# Patient Record
Sex: Female | Born: 1975 | Race: Asian | Hispanic: No | Marital: Married
Health system: Southern US, Community
[De-identification: ages and names within clinical notes are randomized; demographics above are authoritative.]

---

## 2013-09-07 ENCOUNTER — Other Ambulatory Visit: Payer: Self-pay

## 2013-12-02 ENCOUNTER — Other Ambulatory Visit (HOSPITAL_COMMUNITY): Payer: Self-pay | Admitting: Specialist

## 2013-12-02 DIAGNOSIS — O283 Abnormal ultrasonic finding on antenatal screening of mother: Secondary | ICD-10-CM

## 2013-12-14 ENCOUNTER — Encounter (HOSPITAL_COMMUNITY): Payer: Self-pay

## 2013-12-14 ENCOUNTER — Ambulatory Visit (HOSPITAL_COMMUNITY)
Admission: RE | Admit: 2013-12-14 | Discharge: 2013-12-14 | Disposition: A | Discharge status: Home / Self Care | Payer: BC Managed Care – PPO | Source: Ambulatory Visit | Attending: Specialist | Admitting: Specialist

## 2013-12-14 DIAGNOSIS — O283 Abnormal ultrasonic finding on antenatal screening of mother: Secondary | ICD-10-CM

## 2013-12-14 DIAGNOSIS — O289 Unspecified abnormal findings on antenatal screening of mother: Secondary | ICD-10-CM | POA: Insufficient documentation

## 2013-12-14 DIAGNOSIS — Z3689 Encounter for other specified antenatal screening: Secondary | ICD-10-CM | POA: Insufficient documentation

## 2014-05-30 ENCOUNTER — Encounter (HOSPITAL_COMMUNITY): Payer: Self-pay

## 2014-10-19 ENCOUNTER — Encounter (HOSPITAL_COMMUNITY): Payer: Self-pay | Admitting: *Deleted

## 2015-03-07 IMAGING — US US OB DETAIL+14 WK
1 series · 12 of 28 positions shown · non-contrast
Comparison: none

[Series 1: us ob detail+14 wk · 0.28mm/px · 80 acquisitions, 12 frames shown]
[im 3/80]
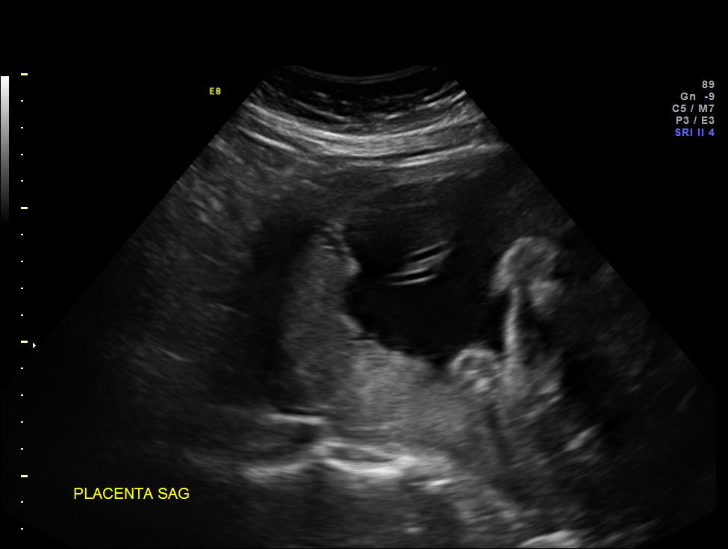
[im 9/80]
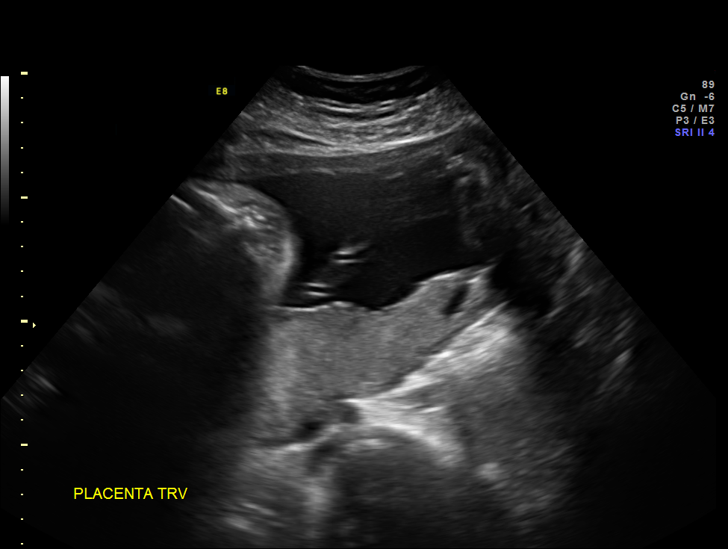
[im 15/80]
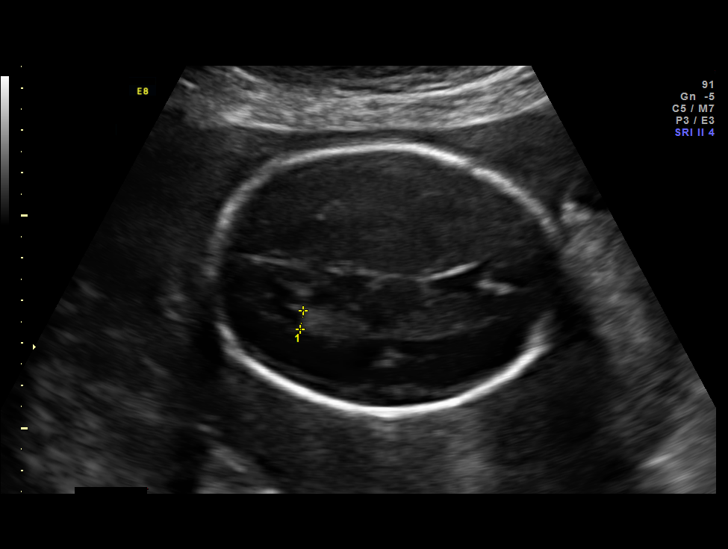
[im 24/80]
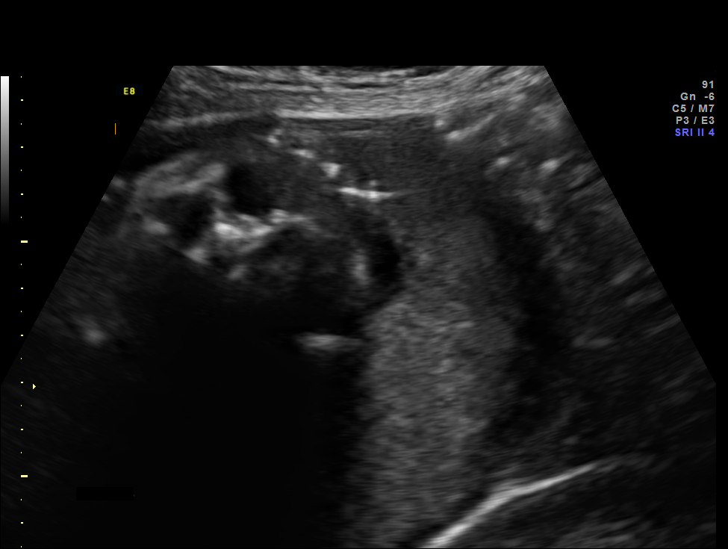
[im 30/80]
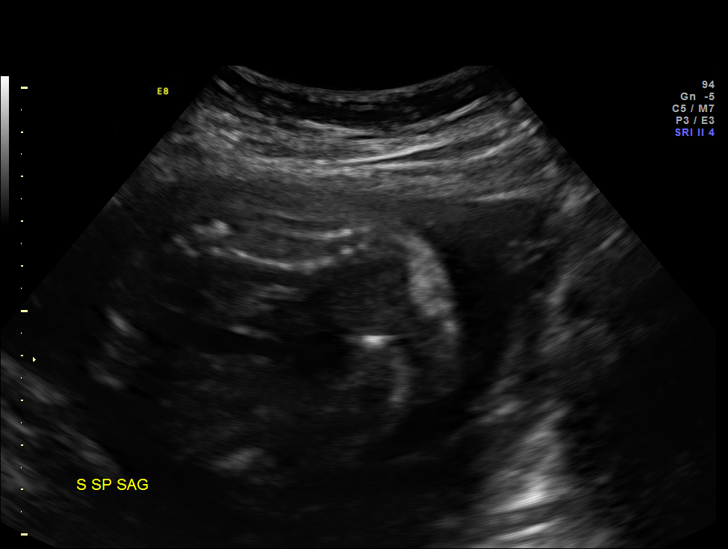
[im 36/80]
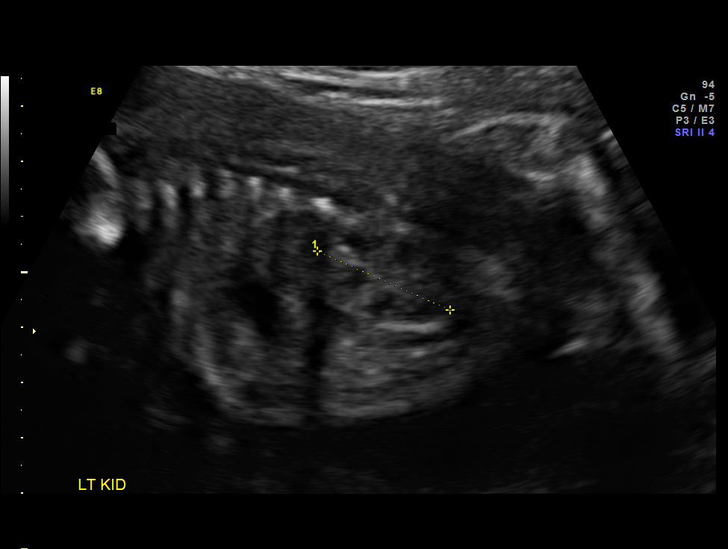
[im 44/80]
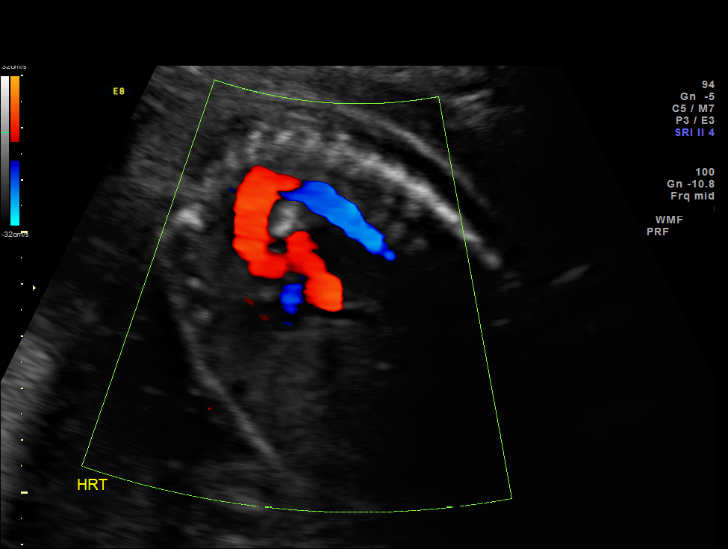
[im 50/80]
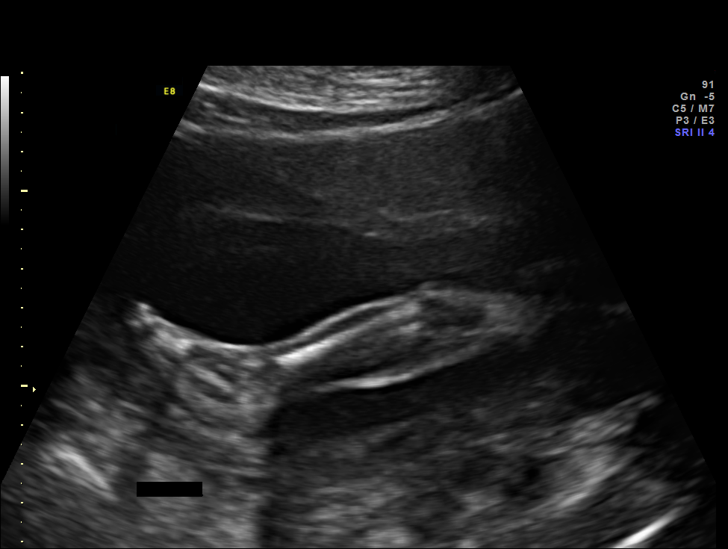
[im 56/80]
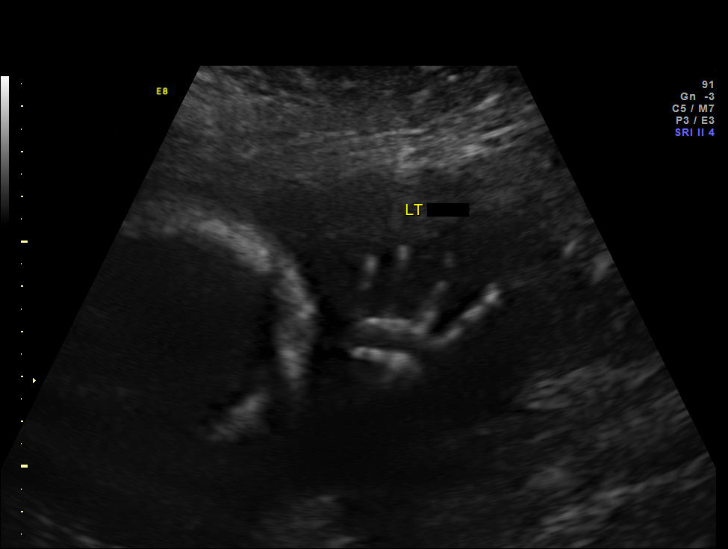
[im 65/80]
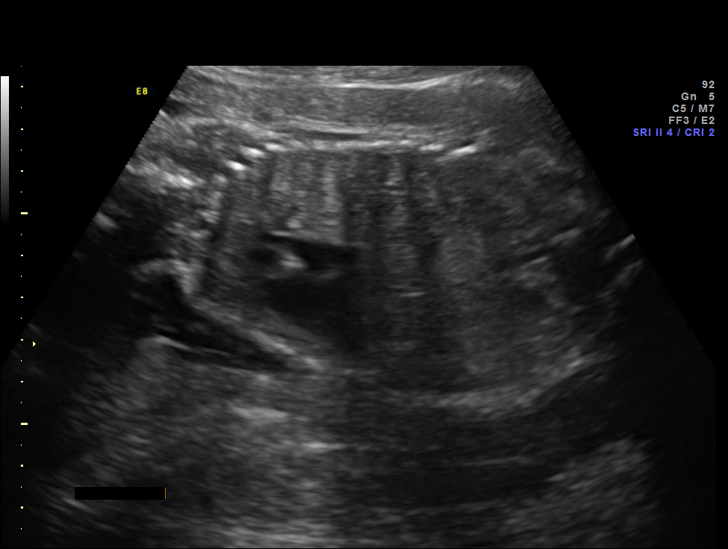
[im 71/80]
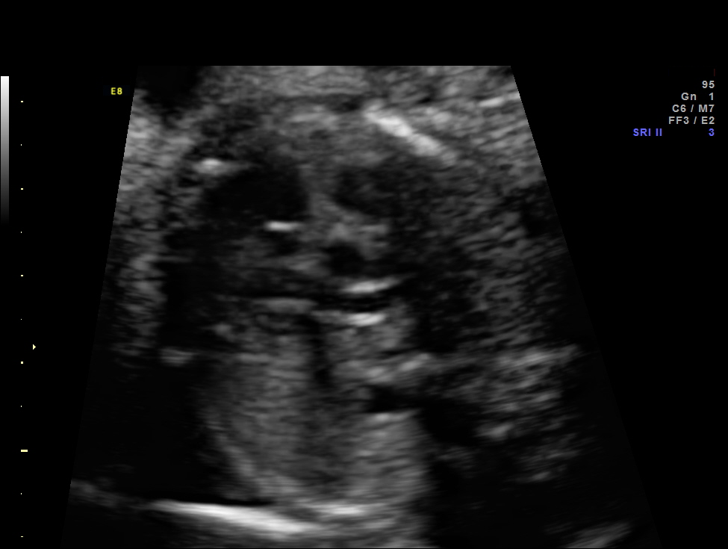
[im 77/80]
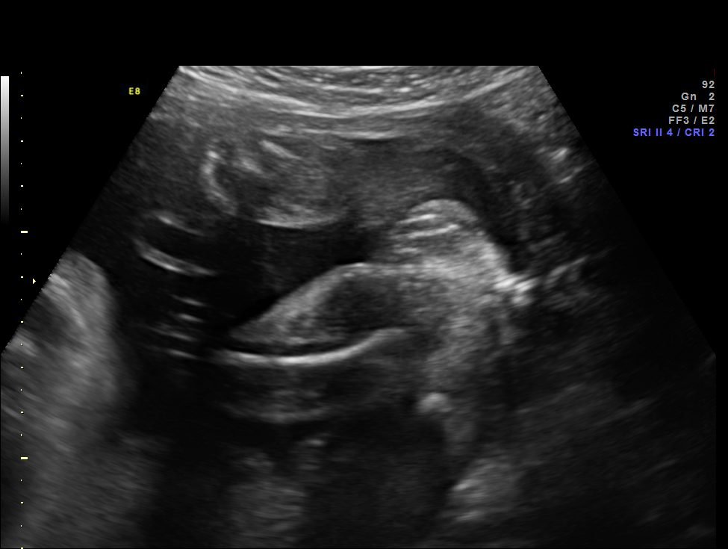

[12 of 28 positions shown; findings below may reference images not displayed]

OBSTETRICS REPORT
                      (Signed Final 12/14/2013 [DATE])

Service(s) Provided

 US OB DETAIL + 14 WK                                  76811.0
Indications

 Detailed fetal anatomic survey
 Advanced maternal age (AMA), Multigravida - low
 risk NIPS
 Choroid plexus cyst
 Pregnancy resulting from assisted reproductive
 technology (IUI)
Fetal Evaluation

 Num Of Fetuses:    1
 Fetal Heart Rate:  152                          bpm
 Cardiac Activity:  Observed
 Presentation:      Breech
 Placenta:          Posterior, above cervical
                    os
 P. Cord            Visualized, central
 Insertion:

 Amniotic Fluid
 AFI FV:      Subjectively within normal limits
                                             Larg Pckt:     5.4  cm
Biometry

 BPD:     62.7  mm     G. Age:  25w 3d                CI:         73.2   70 - 86
 OFD:     85.6  mm                                    FL/HC:      18.4   18.7 -

 HC:     237.5  mm     G. Age:  25w 6d       41  %    HC/AC:      1.12   1.04 -

 AC:     211.9  mm     G. Age:  25w 5d       50  %    FL/BPD:     69.7   71 - 87
 FL:      43.7  mm     G. Age:  24w 3d       10  %    FL/AC:      20.6   20 - 24
 HUM:     42.5  mm     G. Age:  25w 4d       46  %
 CER:     28.4  mm     G. Age:  25w 3d       50  %

 Est. FW:     784  gm    1 lb 12 oz      49  %
Gestational Age

 LMP:           25w 3d        Date:  06/19/13                 EDD:   03/26/14
 U/S Today:     25w 3d                                        EDD:   03/26/14
 Best:          25w 3d     Det. By:  LMP  (06/19/13)          EDD:   03/26/14
Anatomy

 Cranium:          Appears normal         Aortic Arch:      Appears normal
 Fetal Cavum:      Appears normal         Ductal Arch:      Appears normal
 Ventricles:       Appears normal         Diaphragm:        Appears normal
 Choroid Plexus:   Appears normal         Stomach:          Appears normal
 Cerebellum:       Appears normal         Abdomen:          Appears normal
 Posterior Fossa:  Appears normal         Abdominal Wall:   Appears nml (cord
                                                            insert, abd wall)
 Nuchal Fold:      Not applicable (>20    Cord Vessels:     Appears normal (3
                   wks GA)                                  vessel cord)
 Face:             Appears normal         Kidneys:          Appear normal
                   (orbits and profile)
 Lips:             Appears normal         Bladder:          Appears normal
 Heart:            Appears normal         Spine:            Appears normal
                   (4CH, axis, and
                   situs)
 RVOT:             Appears normal         Lower             Visualized
                                          Extremities:
 LVOT:             Appears normal         Upper             Visualized
                                          Extremities:

 Other:  Heels visualized. Open hands visualized. Technically difficult due to
         fetal position.
Targeted Anatomy

 Fetal Central Nervous System
 Cisterna Magna:
Cervix Uterus Adnexa

 Cervical Length:    3.3      cm

 Cervix:       Normal appearance by transabdominal scan.

 Left Ovary:    Within normal limits.
 Right Ovary:   Within normal limits.
 Adnexa:     No abnormality visualized.
Impression

 SIUP at 25+3 weeks
 Normal detailed fetal anatomy; CP cysts resolved; no
 stigmata of T18
 Normal amniotic fluid volume
 Measurements consistent with LMP dating; EFW at the 49th
 %tile
Recommendations

 Follow-up as clinically indicated

## 2017-09-06 ENCOUNTER — Emergency Department (HOSPITAL_BASED_OUTPATIENT_CLINIC_OR_DEPARTMENT_OTHER)
Admission: EM | Admit: 2017-09-06 | Discharge: 2017-09-06 | Disposition: A | Discharge status: Home / Self Care | Payer: BLUE CROSS/BLUE SHIELD | Attending: Emergency Medicine | Admitting: Emergency Medicine

## 2017-09-06 ENCOUNTER — Encounter (HOSPITAL_BASED_OUTPATIENT_CLINIC_OR_DEPARTMENT_OTHER): Payer: Self-pay

## 2017-09-06 DIAGNOSIS — Z973 Presence of spectacles and contact lenses: Secondary | ICD-10-CM | POA: Diagnosis not present

## 2017-09-06 DIAGNOSIS — W228XXA Striking against or struck by other objects, initial encounter: Secondary | ICD-10-CM | POA: Insufficient documentation

## 2017-09-06 DIAGNOSIS — S0591XA Unspecified injury of right eye and orbit, initial encounter: Secondary | ICD-10-CM | POA: Diagnosis present

## 2017-09-06 DIAGNOSIS — S0501XA Injury of conjunctiva and corneal abrasion without foreign body, right eye, initial encounter: Secondary | ICD-10-CM | POA: Insufficient documentation

## 2017-09-06 DIAGNOSIS — Y999 Unspecified external cause status: Secondary | ICD-10-CM | POA: Insufficient documentation

## 2017-09-06 DIAGNOSIS — Y939 Activity, unspecified: Secondary | ICD-10-CM | POA: Diagnosis not present

## 2017-09-06 DIAGNOSIS — Y929 Unspecified place or not applicable: Secondary | ICD-10-CM | POA: Insufficient documentation

## 2017-09-06 MED ORDER — TETRACAINE HCL 0.5 % OP SOLN
2.0000 [drp] | Freq: Once | OPHTHALMIC | Status: AC
  Administered 2017-09-06: 2 [drp] via OPHTHALMIC
  Filled 2017-09-06: qty 4

## 2017-09-06 MED ORDER — FLUORESCEIN SODIUM 1 MG OP STRP
ORAL_STRIP | OPHTHALMIC | Status: AC
  Administered 2017-09-06: 1
  Filled 2017-09-06: qty 1

## 2017-09-06 MED ORDER — CIPROFLOXACIN HCL 0.3 % OP SOLN: 2.0000 [drp] | OPHTHALMIC | 0 refills | Status: AC

## 2017-09-06 NOTE — Discharge Instructions (Signed)
Cipro drops as prescribed.  No contact lens wearing for the next 2 weeks.  Follow-up with ophthalmology in the next 2-3 days.  If you are unable to schedule with or do not have an ophthalmologist, the contact information for Dr. Allena KatzPatel has been provided in this discharge summary for you to call and make follow-up arrangements.  Return to the emergency department in the meantime if symptoms significantly worsen or change.

## 2017-09-06 NOTE — ED Provider Notes (Signed)
[facility] pharmacy called regarding back ordered ciprofloxacin ophthalmic solution for patient's corneal abrasion.  She is a contact lens wearer.  We were asked to change prescription.  Will change to levofloxacin.  1-2 drops every 2 hours while awake for 2 days, then 1-2 drops every 4-8 hours for 5 days.   Emi HolesLaw, Chana Lindstrom M, PA-C 09/06/17 1120    Doug SouJacubowitz, Sam, MD 09/06/17 1536

## 2017-09-06 NOTE — ED Triage Notes (Signed)
Pt states unable to get rt contact out of eye x2hrs, states its to dry, redness and irritation noted

## 2017-09-06 NOTE — ED Provider Notes (Signed)
[facility] EMERGENCY DEPARTMENT Provider Note   CSN: [id number] Arrival date & time: 09/06/17  0118     History   Chief Complaint Chief Complaint  Patient presents with  . Eye Problem    contact stuck in eye    HPI Nathen Mayriscilla Moccio is a 42 y.o. female.  Patient is a 42 year old female presenting with complaints of right eye irritation.  She is a contact lens wearer who feels as though her contact has adhered to her cornea and she cannot remove it.   The history is provided by the patient.  Eye Problem   This is a new problem. The current episode started 6 to 12 hours ago. The problem occurs constantly. The problem has been gradually worsening. There is a problem in the right eye. The injury mechanism was contact lenses. The pain is moderate. Associated symptoms include foreign body sensation. Pertinent negatives include no blurred vision.    History reviewed. No pertinent past medical history.  There are no active problems to display for this patient.   History reviewed. No pertinent surgical history.  OB History    Gravida Para Term Preterm AB Living   1 0 0 0 0 0   SAB TAB Ectopic Multiple Live Births   0 0 0 0         Home Medications    Prior to Admission medications   Medication Sig Start Date End Date Taking? Authorizing Provider  Prenatal Vit w/Fe-Methylfol-FA (PNV PO) Take by mouth.    [provider]    Family History No family history on file.  Social History Social History   Tobacco Use  . Smoking status: Not on file  Substance Use Topics  . Alcohol use: Not on file  . Drug use: Not on file     Allergies   Patient has no known allergies.   Review of Systems Review of Systems  Eyes: Negative for blurred vision.  All other systems reviewed and are negative.    Physical Exam Updated Vital Signs BP 135/81 (BP Location: Right Arm)   Pulse 70   Temp 98.5 F (36.9 C) (Oral)   Resp 18   LMP 08/23/2017   SpO2  100%   Physical Exam  Constitutional: She appears well-developed and well-nourished. No distress.  HENT:  Head: Normocephalic and atraumatic.  Eyes: Pupils are equal, round, and reactive to light.  The right conjunctiva is injected.  I am unable to identify any evidence that there is a contact lens present.  The eyelids were inverted and none was found.  With fluorescein staining, I was able to identify an abrasion at approximately 7:00 on the cornea.  Neck: Normal range of motion. Neck supple.  Pulmonary/Chest: Effort normal.  Skin: Skin is warm and dry. She is not diaphoretic.  Nursing note and vitals reviewed.    ED Treatments / Results  Labs (all labs ordered are listed, but only abnormal results are displayed) Labs Reviewed - No data to display  EKG  EKG Interpretation None       Radiology No results found.  Procedures Procedures (including critical care time)  Medications Ordered in ED Medications  tetracaine (PONTOCAINE) 0.5 % ophthalmic solution 2 drop (not administered)  fluorescein 1 MG ophthalmic strip (not administered)     Initial Impression / Assessment and Plan / ED Course  I have reviewed the triage vital signs and the nursing notes.  Pertinent labs & imaging results that were available during my care  of the patient were reviewed by me and considered in my medical decision making (see chart for details).  Patient with foreign body sensation to the right eye believing that her contact lens is adhered to her cornea.  There is no contact lens in her eye and I am certain it has fallen out.  She does have an abrasion to the cornea which will be treated with Cipro drops.  She is to follow-up with her ophthalmologist if symptoms are not improving.  Final Clinical Impressions(s) / ED Diagnoses   Final diagnoses:  None    ED Discharge Orders    None       Geoffery Lyons, MD 09/06/17 [phone]
